# Patient Record
Sex: Female | Born: 1940 | Race: Black or African American | Hispanic: No | Smoking: Never smoker
Health system: Southern US, Community
[De-identification: ages and names within clinical notes are randomized; demographics above are authoritative.]

## PROBLEM LIST (undated history)

## (undated) DIAGNOSIS — D573 Sickle-cell trait: Secondary | ICD-10-CM

## (undated) DIAGNOSIS — C50919 Malignant neoplasm of unspecified site of unspecified female breast: Secondary | ICD-10-CM

## (undated) DIAGNOSIS — Z9889 Other specified postprocedural states: Secondary | ICD-10-CM

## (undated) DIAGNOSIS — I10 Essential (primary) hypertension: Secondary | ICD-10-CM

## (undated) HISTORY — PX: ABDOMINAL HYSTERECTOMY: SHX81

## (undated) HISTORY — DX: Other specified postprocedural states: Z98.890

## (undated) HISTORY — PX: OTHER SURGICAL HISTORY: SHX169

## (undated) HISTORY — DX: Sickle-cell trait: D57.3

## (undated) HISTORY — DX: Malignant neoplasm of unspecified site of unspecified female breast: C50.919

## (undated) HISTORY — PX: APPENDECTOMY: SHX54

## (undated) HISTORY — DX: Essential (primary) hypertension: I10

## (undated) HISTORY — PX: BACK SURGERY: SHX140

## (undated) HISTORY — PX: KNEE ARTHROSCOPY: SHX127

## (undated) HISTORY — PX: CHOLECYSTECTOMY: SHX55

---

## 1969-09-20 HISTORY — PX: TUBAL LIGATION: SHX77

## 1984-09-20 HISTORY — PX: MASTECTOMY PARTIAL / LUMPECTOMY: SUR851

## 1997-09-20 HISTORY — PX: OTHER SURGICAL HISTORY: SHX169

## 2001-01-02 ENCOUNTER — Encounter: Admission: RE | Admit: 2001-01-02 | Discharge: 2001-01-02 | Payer: Self-pay | Admitting: Oncology

## 2001-01-02 ENCOUNTER — Encounter (HOSPITAL_COMMUNITY): Admission: RE | Admit: 2001-01-02 | Discharge: 2001-02-01 | Payer: Self-pay | Admitting: Oncology

## 2001-05-10 ENCOUNTER — Emergency Department (HOSPITAL_COMMUNITY): Admission: EM | Admit: 2001-05-10 | Discharge: 2001-05-10 | Payer: Self-pay | Admitting: Internal Medicine

## 2001-05-10 ENCOUNTER — Encounter: Payer: Self-pay | Admitting: Internal Medicine

## 2001-06-12 ENCOUNTER — Encounter (HOSPITAL_COMMUNITY): Admission: RE | Admit: 2001-06-12 | Discharge: 2001-07-12 | Payer: Self-pay | Admitting: Oncology

## 2001-06-12 ENCOUNTER — Encounter: Admission: RE | Admit: 2001-06-12 | Discharge: 2001-06-12 | Payer: Self-pay | Admitting: Oncology

## 2001-12-11 ENCOUNTER — Encounter: Admission: RE | Admit: 2001-12-11 | Discharge: 2001-12-11 | Payer: Self-pay | Admitting: Oncology

## 2001-12-11 ENCOUNTER — Encounter (HOSPITAL_COMMUNITY): Admission: RE | Admit: 2001-12-11 | Discharge: 2002-01-10 | Payer: Self-pay | Admitting: Oncology

## 2001-12-12 ENCOUNTER — Encounter (HOSPITAL_COMMUNITY): Payer: Self-pay | Admitting: Oncology

## 2002-05-24 ENCOUNTER — Encounter: Payer: Self-pay | Admitting: Emergency Medicine

## 2002-05-24 ENCOUNTER — Inpatient Hospital Stay (HOSPITAL_COMMUNITY): Admission: EM | Admit: 2002-05-24 | Discharge: 2002-05-26 | Payer: Self-pay | Admitting: *Deleted

## 2002-06-18 ENCOUNTER — Encounter (HOSPITAL_COMMUNITY): Admission: RE | Admit: 2002-06-18 | Discharge: 2002-07-18 | Payer: Self-pay | Admitting: Oncology

## 2002-06-18 ENCOUNTER — Encounter: Admission: RE | Admit: 2002-06-18 | Discharge: 2002-06-18 | Payer: Self-pay | Admitting: Oncology

## 2002-12-25 ENCOUNTER — Encounter (HOSPITAL_COMMUNITY): Admission: RE | Admit: 2002-12-25 | Discharge: 2003-01-24 | Payer: Self-pay | Admitting: Oncology

## 2002-12-25 ENCOUNTER — Encounter: Admission: RE | Admit: 2002-12-25 | Discharge: 2002-12-25 | Payer: Self-pay | Admitting: Oncology

## 2003-06-26 ENCOUNTER — Encounter (HOSPITAL_COMMUNITY): Admission: RE | Admit: 2003-06-26 | Discharge: 2003-07-26 | Payer: Self-pay | Admitting: Oncology

## 2003-06-26 ENCOUNTER — Encounter: Admission: RE | Admit: 2003-06-26 | Discharge: 2003-06-26 | Payer: Self-pay | Admitting: Oncology

## 2003-06-29 ENCOUNTER — Emergency Department (HOSPITAL_COMMUNITY): Admission: EM | Admit: 2003-06-29 | Discharge: 2003-06-29 | Payer: Self-pay | Admitting: Emergency Medicine

## 2003-06-29 ENCOUNTER — Encounter: Payer: Self-pay | Admitting: Emergency Medicine

## 2003-07-02 ENCOUNTER — Encounter: Payer: Self-pay | Admitting: Orthopaedic Surgery

## 2003-07-02 ENCOUNTER — Ambulatory Visit (HOSPITAL_COMMUNITY): Admission: RE | Admit: 2003-07-02 | Discharge: 2003-07-02 | Payer: Self-pay | Admitting: Orthopaedic Surgery

## 2003-07-04 ENCOUNTER — Ambulatory Visit (HOSPITAL_COMMUNITY): Admission: RE | Admit: 2003-07-04 | Discharge: 2003-07-04 | Payer: Self-pay | Admitting: Orthopaedic Surgery

## 2003-12-19 ENCOUNTER — Encounter (HOSPITAL_COMMUNITY): Admission: RE | Admit: 2003-12-19 | Discharge: 2004-01-18 | Payer: Self-pay | Admitting: Oncology

## 2004-04-28 ENCOUNTER — Emergency Department (HOSPITAL_COMMUNITY): Admission: EM | Admit: 2004-04-28 | Discharge: 2004-04-28 | Payer: Self-pay | Admitting: Emergency Medicine

## 2004-05-27 ENCOUNTER — Other Ambulatory Visit: Admission: RE | Admit: 2004-05-27 | Discharge: 2004-05-27 | Payer: Self-pay | Admitting: Otolaryngology

## 2004-06-02 ENCOUNTER — Encounter (HOSPITAL_COMMUNITY): Admission: RE | Admit: 2004-06-02 | Discharge: 2004-06-19 | Payer: Self-pay | Admitting: Pulmonary Disease

## 2004-06-16 ENCOUNTER — Ambulatory Visit (HOSPITAL_COMMUNITY): Admission: RE | Admit: 2004-06-16 | Discharge: 2004-06-16 | Payer: Self-pay | Admitting: Otolaryngology

## 2004-07-06 ENCOUNTER — Encounter: Admission: RE | Admit: 2004-07-06 | Discharge: 2004-07-06 | Payer: Self-pay | Admitting: Oncology

## 2004-07-06 ENCOUNTER — Encounter (HOSPITAL_COMMUNITY): Admission: RE | Admit: 2004-07-06 | Discharge: 2004-08-05 | Payer: Self-pay | Admitting: Oncology

## 2004-09-30 ENCOUNTER — Ambulatory Visit (HOSPITAL_COMMUNITY): Admission: RE | Admit: 2004-09-30 | Discharge: 2004-09-30 | Payer: Self-pay | Admitting: Otolaryngology

## 2004-10-07 ENCOUNTER — Ambulatory Visit (HOSPITAL_COMMUNITY): Admission: RE | Admit: 2004-10-07 | Discharge: 2004-10-07 | Payer: Self-pay | Admitting: Otolaryngology

## 2005-03-03 ENCOUNTER — Ambulatory Visit (HOSPITAL_COMMUNITY): Admission: RE | Admit: 2005-03-03 | Discharge: 2005-03-03 | Payer: Self-pay | Admitting: Pulmonary Disease

## 2005-04-15 ENCOUNTER — Encounter (HOSPITAL_COMMUNITY): Admission: RE | Admit: 2005-04-15 | Discharge: 2005-05-15 | Payer: Self-pay | Admitting: Orthopaedic Surgery

## 2005-09-20 HISTORY — PX: NECK SURGERY: SHX720

## 2006-06-06 ENCOUNTER — Encounter: Admission: RE | Admit: 2006-06-06 | Discharge: 2006-06-17 | Payer: Self-pay | Admitting: Oncology

## 2006-06-06 ENCOUNTER — Ambulatory Visit (HOSPITAL_COMMUNITY): Payer: Self-pay | Admitting: Oncology

## 2006-06-09 ENCOUNTER — Ambulatory Visit: Payer: Self-pay | Admitting: Internal Medicine

## 2006-08-18 ENCOUNTER — Encounter (HOSPITAL_COMMUNITY): Admission: RE | Admit: 2006-08-18 | Discharge: 2006-09-17 | Payer: Self-pay | Admitting: Oncology

## 2006-08-18 ENCOUNTER — Encounter: Admission: RE | Admit: 2006-08-18 | Discharge: 2006-08-18 | Payer: Self-pay | Admitting: Oncology

## 2006-09-05 ENCOUNTER — Ambulatory Visit (HOSPITAL_COMMUNITY): Payer: Self-pay | Admitting: Oncology

## 2007-04-11 ENCOUNTER — Ambulatory Visit (HOSPITAL_COMMUNITY): Payer: Self-pay | Admitting: Oncology

## 2007-04-11 ENCOUNTER — Encounter (HOSPITAL_COMMUNITY): Admission: RE | Admit: 2007-04-11 | Discharge: 2007-05-11 | Payer: Self-pay | Admitting: Oncology

## 2007-04-24 ENCOUNTER — Ambulatory Visit (HOSPITAL_COMMUNITY): Admission: RE | Admit: 2007-04-24 | Discharge: 2007-04-24 | Payer: Self-pay | Admitting: Pulmonary Disease

## 2007-10-17 ENCOUNTER — Encounter (HOSPITAL_COMMUNITY): Admission: RE | Admit: 2007-10-17 | Discharge: 2007-11-16 | Payer: Self-pay | Admitting: Oncology

## 2007-10-17 ENCOUNTER — Ambulatory Visit (HOSPITAL_COMMUNITY): Payer: Self-pay | Admitting: Oncology

## 2008-11-12 ENCOUNTER — Encounter (HOSPITAL_COMMUNITY): Admission: RE | Admit: 2008-11-12 | Discharge: 2008-12-12 | Payer: Self-pay | Admitting: Oncology

## 2008-11-12 ENCOUNTER — Ambulatory Visit (HOSPITAL_COMMUNITY): Payer: Self-pay | Admitting: Oncology

## 2009-05-29 ENCOUNTER — Encounter (HOSPITAL_COMMUNITY): Admission: RE | Admit: 2009-05-29 | Discharge: 2009-06-18 | Payer: Self-pay | Admitting: Oncology

## 2009-05-29 ENCOUNTER — Ambulatory Visit (HOSPITAL_COMMUNITY): Payer: Self-pay | Admitting: Oncology

## 2009-12-11 ENCOUNTER — Ambulatory Visit (HOSPITAL_COMMUNITY): Payer: Self-pay | Admitting: Oncology

## 2009-12-11 ENCOUNTER — Encounter (HOSPITAL_COMMUNITY): Admission: RE | Admit: 2009-12-11 | Discharge: 2010-01-10 | Payer: Self-pay | Admitting: Oncology

## 2010-06-15 ENCOUNTER — Encounter (INDEPENDENT_AMBULATORY_CARE_PROVIDER_SITE_OTHER): Payer: Self-pay | Admitting: *Deleted

## 2010-06-17 ENCOUNTER — Ambulatory Visit (HOSPITAL_COMMUNITY): Payer: Self-pay | Admitting: Oncology

## 2010-06-17 ENCOUNTER — Encounter (HOSPITAL_COMMUNITY)
Admission: RE | Admit: 2010-06-17 | Discharge: 2010-07-17 | Payer: Self-pay | Source: Home / Self Care | Admitting: Oncology

## 2010-09-20 HISTORY — PX: CATARACT EXTRACTION: SUR2

## 2010-10-10 ENCOUNTER — Encounter (HOSPITAL_COMMUNITY): Payer: Self-pay | Admitting: Oncology

## 2010-10-11 ENCOUNTER — Encounter (HOSPITAL_COMMUNITY): Payer: Self-pay | Admitting: Oncology

## 2010-10-22 NOTE — Letter (Signed)
Summary: Recall, Screening Colonoscopy Only  [facility]  [address]   [place], [place] [address]   Phone: [phone]  Fax: [phone]    June 15, 2010  [facility] [address] [place], [place]  [address] [birth year]   Dear Ms. Intriago,   Our records indicate it is time to schedule your colonoscopy.    Please call our office at [phone] and ask for the nurse.   Thank you,    Hendricks Limes, LPN Cloria Spring, LPN  [facility] Ph: [phone]   Fax: [phone]   Appended Document: Recall, Screening Colonoscopy Only The above letter was returned. A new letter mailed today, addressed to [address]  [place], [place] [address]  (address in IDX )

## 2010-12-03 LAB — COMPREHENSIVE METABOLIC PANEL
AST: 20 U/L (ref 0–37)
Albumin: 3.6 g/dL (ref 3.5–5.2)
Alkaline Phosphatase: 72 U/L (ref 39–117)
BUN: 16 mg/dL (ref 6–23)
CO2: 28 mEq/L (ref 19–32)
Chloride: 102 mEq/L (ref 96–112)
Creatinine, Ser: 1.16 mg/dL (ref 0.4–1.2)
GFR calc Af Amer: 56 mL/min — ABNORMAL LOW (ref >60)
GFR calc non Af Amer: 46 mL/min — ABNORMAL LOW (ref >60)
Potassium: 3.7 mEq/L (ref 3.5–5.1)
Total Bilirubin: 0.6 mg/dL (ref 0.3–1.2)

## 2010-12-07 ENCOUNTER — Other Ambulatory Visit (HOSPITAL_COMMUNITY): Payer: Self-pay | Admitting: Oncology

## 2010-12-07 DIAGNOSIS — Z139 Encounter for screening, unspecified: Secondary | ICD-10-CM

## 2010-12-11 ENCOUNTER — Ambulatory Visit (HOSPITAL_COMMUNITY)
Admission: RE | Admit: 2010-12-11 | Discharge: 2010-12-11 | Disposition: A | Discharge status: Home / Self Care | Payer: Medicare Other | Source: Ambulatory Visit | Attending: Oncology | Admitting: Oncology

## 2010-12-11 DIAGNOSIS — Z139 Encounter for screening, unspecified: Secondary | ICD-10-CM

## 2010-12-11 DIAGNOSIS — Z1231 Encounter for screening mammogram for malignant neoplasm of breast: Secondary | ICD-10-CM | POA: Insufficient documentation

## 2010-12-11 LAB — COMPREHENSIVE METABOLIC PANEL
Albumin: 3.6 g/dL (ref 3.5–5.2)
Alkaline Phosphatase: 100 U/L (ref 39–117)
BUN: 4 mg/dL — ABNORMAL LOW (ref 6–23)
Calcium: 9.4 mg/dL (ref 8.4–10.5)
Glucose, Bld: 227 mg/dL — ABNORMAL HIGH (ref 70–99)
Potassium: 3.8 mEq/L (ref 3.5–5.1)
Total Protein: 6.1 g/dL (ref 6.0–8.3)

## 2010-12-15 ENCOUNTER — Other Ambulatory Visit (HOSPITAL_COMMUNITY): Payer: Self-pay | Admitting: Oncology

## 2010-12-15 DIAGNOSIS — R928 Other abnormal and inconclusive findings on diagnostic imaging of breast: Secondary | ICD-10-CM

## 2010-12-16 ENCOUNTER — Ambulatory Visit (HOSPITAL_COMMUNITY): Payer: Self-pay

## 2010-12-21 ENCOUNTER — Encounter (HOSPITAL_COMMUNITY): Payer: Medicare Other | Attending: Oncology

## 2010-12-21 ENCOUNTER — Other Ambulatory Visit (HOSPITAL_COMMUNITY): Payer: Self-pay | Admitting: Oncology

## 2010-12-21 DIAGNOSIS — M810 Age-related osteoporosis without current pathological fracture: Secondary | ICD-10-CM

## 2010-12-21 DIAGNOSIS — Z853 Personal history of malignant neoplasm of breast: Secondary | ICD-10-CM | POA: Insufficient documentation

## 2010-12-21 DIAGNOSIS — E119 Type 2 diabetes mellitus without complications: Secondary | ICD-10-CM | POA: Insufficient documentation

## 2010-12-21 DIAGNOSIS — C8589 Other specified types of non-Hodgkin lymphoma, extranodal and solid organ sites: Secondary | ICD-10-CM

## 2010-12-21 DIAGNOSIS — Z79899 Other long term (current) drug therapy: Secondary | ICD-10-CM | POA: Insufficient documentation

## 2010-12-21 DIAGNOSIS — Z9221 Personal history of antineoplastic chemotherapy: Secondary | ICD-10-CM | POA: Insufficient documentation

## 2010-12-21 LAB — COMPREHENSIVE METABOLIC PANEL
ALT: 16 U/L (ref 0–35)
AST: 16 U/L (ref 0–37)
Alkaline Phosphatase: 65 U/L (ref 39–117)
CO2: 25 mEq/L (ref 19–32)
Chloride: 107 mEq/L (ref 96–112)
GFR calc Af Amer: >60 mL/min (ref >60)
GFR calc non Af Amer: >60 mL/min (ref >60)
Glucose, Bld: 181 mg/dL — ABNORMAL HIGH (ref 70–99)
Potassium: 3.6 mEq/L (ref 3.5–5.1)
Sodium: 140 mEq/L (ref 135–145)
Total Bilirubin: 0.4 mg/dL (ref 0.3–1.2)

## 2010-12-23 ENCOUNTER — Other Ambulatory Visit (HOSPITAL_COMMUNITY): Payer: Self-pay | Admitting: Oncology

## 2010-12-23 ENCOUNTER — Ambulatory Visit (HOSPITAL_COMMUNITY)
Admission: RE | Admit: 2010-12-23 | Discharge: 2010-12-23 | Disposition: A | Discharge status: Home / Self Care | Payer: Medicare Other | Source: Ambulatory Visit | Attending: Oncology | Admitting: Oncology

## 2010-12-23 DIAGNOSIS — R921 Mammographic calcification found on diagnostic imaging of breast: Secondary | ICD-10-CM

## 2010-12-23 DIAGNOSIS — R928 Other abnormal and inconclusive findings on diagnostic imaging of breast: Secondary | ICD-10-CM

## 2010-12-25 LAB — COMPREHENSIVE METABOLIC PANEL
AST: 27 U/L (ref 0–37)
Albumin: 4 g/dL (ref 3.5–5.2)
BUN: 4 mg/dL — ABNORMAL LOW (ref 6–23)
Calcium: 9.7 mg/dL (ref 8.4–10.5)
Chloride: 105 mEq/L (ref 96–112)
Creatinine, Ser: 0.85 mg/dL (ref 0.4–1.2)
GFR calc Af Amer: >60 mL/min (ref >60)
Total Bilirubin: 0.6 mg/dL (ref 0.3–1.2)
Total Protein: 7.1 g/dL (ref 6.0–8.3)

## 2010-12-28 ENCOUNTER — Ambulatory Visit
Admission: RE | Admit: 2010-12-28 | Discharge: 2010-12-28 | Disposition: A | Discharge status: Home / Self Care | Payer: Medicare Other | Source: Ambulatory Visit | Attending: Oncology | Admitting: Oncology

## 2010-12-28 ENCOUNTER — Other Ambulatory Visit: Payer: Self-pay | Admitting: Diagnostic Radiology

## 2010-12-28 ENCOUNTER — Other Ambulatory Visit (HOSPITAL_COMMUNITY): Payer: Self-pay | Admitting: Oncology

## 2010-12-28 DIAGNOSIS — R921 Mammographic calcification found on diagnostic imaging of breast: Secondary | ICD-10-CM

## 2011-01-05 LAB — COMPREHENSIVE METABOLIC PANEL
Albumin: 3.8 g/dL (ref 3.5–5.2)
Alkaline Phosphatase: 116 U/L (ref 39–117)
BUN: 6 mg/dL (ref 6–23)
Calcium: 9.7 mg/dL (ref 8.4–10.5)
Glucose, Bld: 295 mg/dL — ABNORMAL HIGH (ref 70–99)
Potassium: 3.6 mEq/L (ref 3.5–5.1)
Total Protein: 6.7 g/dL (ref 6.0–8.3)

## 2011-01-05 LAB — DIFFERENTIAL
Lymphocytes Relative: 32 % (ref 12–46)
Lymphs Abs: 1.6 10*3/uL (ref 0.7–4.0)
Monocytes Absolute: 0.2 10*3/uL (ref 0.1–1.0)
Monocytes Relative: 4 % (ref 3–12)
Neutro Abs: 3 10*3/uL (ref 1.7–7.7)
Neutrophils Relative %: 61 % (ref 43–77)

## 2011-01-05 LAB — CBC
HCT: 38 % (ref 36.0–46.0)
Hemoglobin: 12.9 g/dL (ref 12.0–15.0)
MCHC: 34 g/dL (ref 30.0–36.0)
RBC: 4.1 MIL/uL (ref 3.87–5.11)

## 2011-02-03 ENCOUNTER — Other Ambulatory Visit (HOSPITAL_COMMUNITY): Payer: Medicare Other

## 2011-02-05 NOTE — Cardiovascular Report (Signed)
NAME:  Ashley Gonzales, Ashley Gonzales                          ACCOUNT NO.:  [id number]   MEDICAL RECORD NO.:  [id number]                   PATIENT TYPE:  INP   LOCATION:  2030                                 FACILITY:  [facility]   PHYSICIAN:  Lennette Bihari, M.D.               DATE OF BIRTH:  11-08-1940   DATE OF PROCEDURE:  DATE OF DISCHARGE:  05/26/2002                              CARDIAC CATHETERIZATION   INDICATION:  The patient is a 70 year old female who had presented to [facility] Emergency Room yesterday with chest pain that had been waxing and  waning.  The pain was somewhat improved with nitroglycerin. She ultimately  was transferred to [facility]. Cardiac enzymes were negative x2.  Risk factors also include type 2 diabetes mellitus, hypertension, and family  history of coronary disease. 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  She is referred for definitive catheterization.   DESCRIPTION OF PROCEDURE:  After premedication with Valium 5 mg p.o., the  patient was prepped and draped in the usual fashion. The right femoral  artery was punctured anteriorly, and a 6 French sheath was inserted.  Diagnostic catheterization was done with 6 French Judkins 4 left and right  coronary catheters. A pigtail catheter was used for biplane left  ventriculography as well as distal aortography. Hemostasis was obtained by  direct manual pressure.   HEMODYNAMIC DATA:  Central aortic pressure 135/69.  Left ventricular  pressure 135/16.   ANGIOGRAPHIC DATA:  Left main coronary artery:  The left main coronary  artery was angiographically normal and bifurcated in the LAD and left  circumflex system.   Left anterior descending:  The LAD had 30% smooth ostial narrowing. The LAD  gave rise to a prominent septal and mid diagonal vessel.   Circumflex artery:  The circumflex vessel gave rise to three marginal  vessels and was angiographically normal.   Right coronary artery:  The  right coronary artery ended in the PDA and small  inferior posterolateral branches. There was mild 20% proximal narrowing.   Biplane cine left ventriculography revealed normal LV function without focal  segmental wall motion abnormalities.   DISTAL AORTOGRAPHY:  Distal aortography revealed mild tortuosity of the  infrarenal aorta. There was no renal artery stenosis.  There was no  significant other aortoiliac disease.    IMPRESSION:  1. Normal left ventricular function.  2. Significant coronary obstructive disease with 30% smooth narrowing of the     ostium of the left anterior descending artery, 20% proximal right     coronary artery narrowing.                                                  Lennette Bihari, M.D.  TAK/MEDQ  D:  05/25/2002  T:  05/28/2002  Job:  [id number]   cc:   Merrilee Seashore, M.D.   Hilario Quarry, M.D.

## 2011-02-05 NOTE — Discharge Summary (Signed)
   Ashley Gonzales, Ashley Gonzales                          ACCOUNT NO.:  [id number]   MEDICAL RECORD NO.:  [id number]                   PATIENT TYPE:  INP   LOCATION:  2030                                 FACILITY:  [facility]   PHYSICIAN:  Runell Gess, M.D.             DATE OF BIRTH:  27-May-1941   DATE OF ADMISSION:  05/24/2002  DATE OF DISCHARGE:  05/26/2002                                 DISCHARGE SUMMARY   DISCHARGE DIAGNOSES:  1. Unstable angina.  2. 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  3. Non-insulin dependent diabetes mellitus.  4. Controlled hypertension.  5. Family history of coronary artery disease.  6. History of traumatic amputation left leg.   HOSPITAL COURSE:  The patient is a 70 year old female transferred from [facility] to [facility] after she presented there with  symptoms consistent with unstable angina.  Enzymes were negative.  She was  put on heparin and nitroglycerin.  She was set up for diagnostic  catheterization which was done by Nicki Guadalajara, M.D. on May 25, 2002.  This revealed 30% narrowing of the LAD, but otherwise no significant  obstructive disease.  LV function was normal.  On May 26, 2002, she was  stable and her right groin was without hematoma.  We feel she can be  discharged and will follow up with her primary care doctor.   DISCHARGE MEDICATIONS:  1. Amaryl 4 mg a day.  2. Hydrochlorothiazide 25 mg a day.  3. Potassium 20 mEq a day.  4. Prevacid 30 mg a day.   LABORATORY DATA:  EKG revealed sinus rhythm, PACs without acute changes.  TSH 1.73.  Lipid profile shows LDL 67, HDL 58, CK-MB and troponins are  negative.  White count 5.6, hemoglobin 4.2, hematocrit 35.8, platelets 235.  Sodium 135, potassium 3.7, BUN 11, creatinine 0.9.   DISPOSITION:  The patient is discharged in stable condition and will follow  up with Dr. Juanetta Gosling.     Abelino Derrick, P.A.                      Runell Gess, M.D.    Lenard Lance  D:  06/22/2002  T:  06/26/2002  Job:  [id number]   cc:   Ramon Dredge L. Juanetta Gosling, M.D.
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
## 2011-02-05 NOTE — Op Note (Signed)
NAME:  Ashley Gonzales, Ashley Gonzales                          ACCOUNT NO.:  [id number]   MEDICAL RECORD NO.:  [id number]                   PATIENT TYPE:  AMB   LOCATION:  DAY                                  FACILITY:  [facility]   PHYSICIAN:  J. Darreld Mclean, M.D.              DATE OF BIRTH:  [birth year]   DATE OF PROCEDURE:  DATE OF DISCHARGE:                                 OPERATIVE REPORT   PREOPERATIVE DIAGNOSIS:  Tear of medial and lateral meniscus of the right  knee.   POSTOPERATIVE DIAGNOSIS:  Tear of lateral meniscus, right knee, with large  synovial flap tear but no meniscal tear medially; degenerative joint  disease.   PROCEDURE:  Operative arthroscopy of the right knee with partial lateral  meniscectomy and removal of excess synovium.   ANESTHESIA:  General.   SURGEON:  J. Darreld Mclean, M.D.   ASSISTANT:  Candace Cruise, P.A.C.   TOURNIQUET TIME:  17 minutes.   DRAINS:  None.   INDICATIONS FOR PROCEDURE:  The patient fell on the job and injured her knee  on the right.  She has a BK amputation on the left.  MRI shows a tear of the  medial and lateral meniscus of the right knee.  Surgery is recommended.  She  has marked pain and tenderness and fusion.  This is a worker's compensation  case, and we have gotten permission from them for the procedure.   OPERATIVE FINDINGS:  A lot of synovium in the anterior pouch.  Medially,  there was a large synovial flap loose in the knee, and this was removed.  The meniscus itself looked good.  There was mild DJD and grade 2-3 changes  medially.  The anterior cruciate had some slight fraying but otherwise  intact laterally.  She had grade 3 arthritic changes laterally plus a tear  of the lateral meniscus posterior horn and fraying of the anterior portion.  No loose bodies.   DESCRIPTION OF PROCEDURE:  The patient was given general anesthesia while  supine on the operating room table.  A tourniquet was placed deflated on the  right upper thigh.   The patient was prepped and draped in the usual manner.  Prior to proceeding, we established that this was Ms. Reder and that we were  doing her right knee arthroscopy.  The leg was wrapped circumferentially  with an Esmarch bandage, and the tourniquet was inflated to 350 mmHg and the  Esmarch bandage removed.  An inflow cannula was inserted medially.  I could  not get a good flow, so I moved it up a little higher and got a good flow.  The arthroscope was inserted laterally and the knee symptomatically  examined.  Please see findings above.   Using a meniscal shaver and meniscal punch, good contour was obtained of the  synovial flap that was present.  We were going to remove that, but there was  no meniscal tear.  Laterally, there was a tear on the posterior horn.  Using  the meniscal shaver and meniscal punch, we got a good smooth contour.  The  knee was symptomatically reexamined, and no new pathology was found.  The  wounds were reapproximated using 3-0 nylon.  Marcaine 0.25% was instilled  into each portal.  The tourniquet was deflated after 17 minutes.  A sterile  dressing was applied.  A bulky dressing was applied.   The patient tolerated the procedure well and went to the recovery room in  good condition.  The patient was given Vicodin ES for pain.  If she has any  difficulties, she is to contact me through the office or hospital beeper  system.  Physical therapy has been arranged for next week.      ___________________________________________                                            Teola Bradley, M.D.   JWK/MEDQ  D:  07/04/2003  T:  07/04/2003  Job:  [id number]

## 2011-02-09 ENCOUNTER — Ambulatory Visit (HOSPITAL_COMMUNITY): Admission: RE | Admit: 2011-02-09 | Payer: Medicare Other | Source: Ambulatory Visit | Admitting: Ophthalmology

## 2011-06-10 LAB — CBC
HCT: 37.2
Hemoglobin: 12.9
MCHC: 34.7
MCV: 89.7
RBC: 4.15
RDW: 13.7

## 2011-06-10 LAB — COMPREHENSIVE METABOLIC PANEL
ALT: 25
BUN: 8
CO2: 29
Calcium: 9.7
GFR calc non Af Amer: >60
Glucose, Bld: 182 — ABNORMAL HIGH
Sodium: 135
Total Protein: 6.4

## 2011-06-10 LAB — DIFFERENTIAL
Basophils Relative: 1
Eosinophils Absolute: 0.1
Lymphs Abs: 1.3
Neutro Abs: 3.3
Neutrophils Relative %: 64

## 2011-06-22 ENCOUNTER — Encounter (HOSPITAL_COMMUNITY): Payer: Medicare Other | Attending: Oncology

## 2011-06-22 DIAGNOSIS — M810 Age-related osteoporosis without current pathological fracture: Secondary | ICD-10-CM

## 2011-06-22 DIAGNOSIS — C50919 Malignant neoplasm of unspecified site of unspecified female breast: Secondary | ICD-10-CM | POA: Insufficient documentation

## 2011-06-22 DIAGNOSIS — E119 Type 2 diabetes mellitus without complications: Secondary | ICD-10-CM | POA: Insufficient documentation

## 2011-06-22 LAB — COMPREHENSIVE METABOLIC PANEL
ALT: 19 U/L (ref 0–35)
Alkaline Phosphatase: 161 U/L — ABNORMAL HIGH (ref 39–117)
BUN: 20 mg/dL (ref 6–23)
CO2: 27 mEq/L (ref 19–32)
GFR calc Af Amer: 71 mL/min — ABNORMAL LOW (ref >90)
GFR calc non Af Amer: 61 mL/min — ABNORMAL LOW (ref >90)
Glucose, Bld: 322 mg/dL — ABNORMAL HIGH (ref 70–99)
Potassium: 3.8 mEq/L (ref 3.5–5.1)
Sodium: 134 mEq/L — ABNORMAL LOW (ref 135–145)
Total Bilirubin: 0.2 mg/dL — ABNORMAL LOW (ref 0.3–1.2)

## 2011-06-22 LAB — CBC
Hemoglobin: 12.5 g/dL (ref 12.0–15.0)
MCV: 88.4 fL (ref 78.0–100.0)
Platelets: 227 10*3/uL (ref 150–400)
RBC: 4.13 MIL/uL (ref 3.87–5.11)
WBC: 5.7 10*3/uL (ref 4.0–10.5)

## 2011-06-22 LAB — DIFFERENTIAL
Lymphocytes Relative: 27 % (ref 12–46)
Lymphs Abs: 1.6 10*3/uL (ref 0.7–4.0)
Monocytes Relative: 7 % (ref 3–12)
Neutrophils Relative %: 63 % (ref 43–77)

## 2011-06-22 MED ORDER — SODIUM CHLORIDE 0.9 % IJ SOLN
INTRAMUSCULAR | Status: AC
  Filled 2011-06-22: qty 10

## 2011-06-22 MED ORDER — SODIUM CHLORIDE 0.9 % IJ SOLN
10.0000 mL | INTRAMUSCULAR | Status: DC | PRN
  Administered 2011-06-22: 10 mL via INTRAVENOUS
  Filled 2011-06-22: qty 10

## 2011-06-22 MED ORDER — SODIUM CHLORIDE 0.9 % IV SOLN
INTRAVENOUS | Status: DC
  Administered 2011-06-22: 12:00:00 via INTRAVENOUS

## 2011-06-22 MED ORDER — SODIUM CHLORIDE 0.9 % IV SOLN
30.0000 mg | Freq: Once | INTRAVENOUS | Status: AC
  Administered 2011-06-22: 30 mg via INTRAVENOUS
  Filled 2011-06-22: qty 500

## 2011-06-25 ENCOUNTER — Encounter (HOSPITAL_COMMUNITY): Payer: Self-pay | Admitting: Oncology

## 2011-06-25 ENCOUNTER — Encounter (HOSPITAL_BASED_OUTPATIENT_CLINIC_OR_DEPARTMENT_OTHER): Payer: Medicare Other | Admitting: Oncology

## 2011-06-25 DIAGNOSIS — C50919 Malignant neoplasm of unspecified site of unspecified female breast: Secondary | ICD-10-CM

## 2011-06-25 DIAGNOSIS — E119 Type 2 diabetes mellitus without complications: Secondary | ICD-10-CM

## 2011-06-25 DIAGNOSIS — I10 Essential (primary) hypertension: Secondary | ICD-10-CM

## 2011-06-25 DIAGNOSIS — Z170 Estrogen receptor positive status [ER+]: Secondary | ICD-10-CM

## 2011-06-25 MED ORDER — GLIPIZIDE 5 MG PO TABS: 5.0000 mg | ORAL_TABLET | Freq: Two times a day (BID) | ORAL | Status: DC

## 2011-06-25 NOTE — Patient Instructions (Signed)
[facility]  Discharge Instructions  RECOMMENDATIONS MADE BY THE CONSULTANT AND ANY TEST RESULTS WILL BE SENT TO YOUR REFERRING DOCTOR.   EXAM FINDINGS BY MD TODAY AND SIGNS AND SYMPTOMS TO REPORT TO CLINIC OR PRIMARY MD: doing well as far as your breast cancer.  Your diabetes needs to be under better control.  We will try to get you an appointment to see Dr. Fransico Him  MEDICATIONS PRESCRIBED: Continue metformin 2 times daily & take glipizide (glucotrol) twice daily. Follow label directions  INSTRUCTIONS GIVEN AND DISCUSSED: Report any lumps, bone pain or shortness of breath.  SPECIAL INSTRUCTIONS/FOLLOW-UP: Return to Clinic in 1 year  and Other (Referral/Appointments) will try to get you in to see Dr. Fransico Him for your diabetes management.   I acknowledge that I have been informed and understand all the instructions given to me and received a copy. I do not have any more questions at this time, but understand that I may call the [facility]ty [facility] at [facility] at [phone] during business hours should I have any further questions or need assistance in obtaining follow-up care.    __________________________________________  _____________  __________ Signature of Patient or Authorized Representative            Date                   Time    __________________________________________ Nurse's Signature

## 2011-06-25 NOTE — Progress Notes (Signed)
This office note has been dictated.
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
## 2011-07-05 LAB — COMPREHENSIVE METABOLIC PANEL
Albumin: 3.6
BUN: 5 — ABNORMAL LOW
Calcium: 9.4
Creatinine, Ser: 0.86
Total Bilirubin: 0.8
Total Protein: 6.3

## 2011-07-26 ENCOUNTER — Emergency Department (HOSPITAL_COMMUNITY)
Admission: EM | Admit: 2011-07-26 | Discharge: 2011-07-26 | Disposition: A | Discharge status: Home / Self Care | Payer: Medicare Other | Attending: Emergency Medicine | Admitting: Emergency Medicine

## 2011-07-26 ENCOUNTER — Encounter (HOSPITAL_COMMUNITY): Payer: Self-pay | Admitting: *Deleted

## 2011-07-26 ENCOUNTER — Emergency Department (HOSPITAL_COMMUNITY): Payer: Medicare Other

## 2011-07-26 DIAGNOSIS — I10 Essential (primary) hypertension: Secondary | ICD-10-CM | POA: Insufficient documentation

## 2011-07-26 DIAGNOSIS — E119 Type 2 diabetes mellitus without complications: Secondary | ICD-10-CM | POA: Insufficient documentation

## 2011-07-26 DIAGNOSIS — M25569 Pain in unspecified knee: Secondary | ICD-10-CM | POA: Insufficient documentation

## 2011-07-26 DIAGNOSIS — IMO0002 Reserved for concepts with insufficient information to code with codable children: Secondary | ICD-10-CM

## 2011-07-26 DIAGNOSIS — S88119A Complete traumatic amputation at level between knee and ankle, unspecified lower leg, initial encounter: Secondary | ICD-10-CM | POA: Insufficient documentation

## 2011-07-26 DIAGNOSIS — R296 Repeated falls: Secondary | ICD-10-CM | POA: Insufficient documentation

## 2011-07-26 DIAGNOSIS — D573 Sickle-cell trait: Secondary | ICD-10-CM | POA: Insufficient documentation

## 2011-07-26 DIAGNOSIS — W19XXXA Unspecified fall, initial encounter: Secondary | ICD-10-CM

## 2011-07-26 DIAGNOSIS — Z853 Personal history of malignant neoplasm of breast: Secondary | ICD-10-CM | POA: Insufficient documentation

## 2011-07-26 DIAGNOSIS — M25579 Pain in unspecified ankle and joints of unspecified foot: Secondary | ICD-10-CM | POA: Insufficient documentation

## 2011-07-26 MED ORDER — HYDROCODONE-ACETAMINOPHEN 5-325 MG PO TABS: 1.0000 | ORAL_TABLET | Freq: Four times a day (QID) | ORAL | Status: AC | PRN
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
## 2011-07-26 NOTE — ED Notes (Signed)
Pt c/o pain to right leg after fall; pt c/o pain to just below knee and swelling to right foot

## 2011-09-21 DIAGNOSIS — Z9889 Other specified postprocedural states: Secondary | ICD-10-CM

## 2011-09-21 HISTORY — DX: Other specified postprocedural states: Z98.890

## 2011-12-21 ENCOUNTER — Ambulatory Visit (HOSPITAL_COMMUNITY): Payer: Medicare Other

## 2012-06-23 ENCOUNTER — Encounter (HOSPITAL_COMMUNITY): Payer: Medicare Other | Attending: Oncology | Admitting: Oncology

## 2012-06-23 ENCOUNTER — Encounter (HOSPITAL_COMMUNITY): Payer: Self-pay | Admitting: Oncology

## 2012-06-23 VITALS — BP 135/74 | HR 69 | Temp 97.8°F | Resp 18 | Wt 191.6 lb

## 2012-06-23 DIAGNOSIS — I10 Essential (primary) hypertension: Secondary | ICD-10-CM

## 2012-06-23 DIAGNOSIS — C50919 Malignant neoplasm of unspecified site of unspecified female breast: Secondary | ICD-10-CM | POA: Insufficient documentation

## 2012-06-23 DIAGNOSIS — M810 Age-related osteoporosis without current pathological fracture: Secondary | ICD-10-CM

## 2012-06-23 DIAGNOSIS — E119 Type 2 diabetes mellitus without complications: Secondary | ICD-10-CM

## 2012-06-23 HISTORY — DX: Malignant neoplasm of unspecified site of unspecified female breast: C50.919

## 2012-06-23 NOTE — Patient Instructions (Addendum)
[facility]  Discharge Instructions  RECOMMENDATIONS MADE BY THE CONSULTANT AND ANY TEST RESULTS WILL BE SENT TO YOUR REFERRING DOCTOR.   EXAM FINDINGS BY MD TODAY AND SIGNS AND SYMPTOMS TO REPORT TO CLINIC OR PRIMARY MD: Exam good  Mammogram should be done yearly.   SPECIAL INSTRUCTIONS/FOLLOW-UP: Return to see [place] in one year   I acknowledge that I have been informed and understand all the instructions given to me and received a copy. I do not have any more questions at this time, but understand that I may call the [facility] at [facility] at [phone] during business hours should I have any further questions or need assistance in obtaining follow-up care.    __________________________________________  _____________  __________ Signature of Patient or Authorized Representative            Date                   Time    __________________________________________ Nurse's Signature
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
## 2012-07-17 ENCOUNTER — Ambulatory Visit (HOSPITAL_COMMUNITY)
Admission: RE | Admit: 2012-07-17 | Discharge: 2012-07-17 | Disposition: A | Discharge status: Home / Self Care | Payer: Medicare Other | Source: Ambulatory Visit | Attending: Oncology | Admitting: Oncology

## 2012-07-17 DIAGNOSIS — C50919 Malignant neoplasm of unspecified site of unspecified female breast: Secondary | ICD-10-CM

## 2012-07-17 DIAGNOSIS — Z1231 Encounter for screening mammogram for malignant neoplasm of breast: Secondary | ICD-10-CM | POA: Insufficient documentation

## 2013-01-05 ENCOUNTER — Emergency Department (HOSPITAL_COMMUNITY)
Admission: EM | Admit: 2013-01-05 | Discharge: 2013-01-05 | Disposition: A | Discharge status: Home / Self Care | Payer: Medicare Other | Attending: Emergency Medicine | Admitting: Emergency Medicine

## 2013-01-05 ENCOUNTER — Encounter (HOSPITAL_COMMUNITY): Payer: Self-pay | Admitting: *Deleted

## 2013-01-05 ENCOUNTER — Emergency Department (HOSPITAL_COMMUNITY): Payer: Medicare Other

## 2013-01-05 DIAGNOSIS — E119 Type 2 diabetes mellitus without complications: Secondary | ICD-10-CM | POA: Insufficient documentation

## 2013-01-05 DIAGNOSIS — Z853 Personal history of malignant neoplasm of breast: Secondary | ICD-10-CM | POA: Insufficient documentation

## 2013-01-05 DIAGNOSIS — Z79899 Other long term (current) drug therapy: Secondary | ICD-10-CM | POA: Insufficient documentation

## 2013-01-05 DIAGNOSIS — I10 Essential (primary) hypertension: Secondary | ICD-10-CM | POA: Insufficient documentation

## 2013-01-05 DIAGNOSIS — Z9889 Other specified postprocedural states: Secondary | ICD-10-CM | POA: Insufficient documentation

## 2013-01-05 DIAGNOSIS — Z862 Personal history of diseases of the blood and blood-forming organs and certain disorders involving the immune mechanism: Secondary | ICD-10-CM | POA: Insufficient documentation

## 2013-01-05 DIAGNOSIS — M79609 Pain in unspecified limb: Secondary | ICD-10-CM | POA: Insufficient documentation

## 2013-01-05 DIAGNOSIS — Z794 Long term (current) use of insulin: Secondary | ICD-10-CM | POA: Insufficient documentation

## 2013-01-05 DIAGNOSIS — M79674 Pain in right toe(s): Secondary | ICD-10-CM

## 2013-01-05 MED ORDER — ACETAMINOPHEN 500 MG PO TABS
1000.0000 mg | ORAL_TABLET | Freq: Once | ORAL | Status: AC
  Administered 2013-01-05: 1000 mg via ORAL
  Filled 2013-01-05: qty 2

## 2013-01-05 MED ORDER — CEPHALEXIN 500 MG PO CAPS: 500.0000 mg | ORAL_CAPSULE | Freq: Four times a day (QID) | ORAL | Status: AC

## 2013-01-05 NOTE — ED Provider Notes (Signed)
History     CSN: [id number]  Arrival date & time 01/05/13  0903   First MD Initiated Contact with Patient 01/05/13 0940      Chief Complaint  Patient presents with  . Toe Pain     HPI Pt was seen at 0950.   Per pt, c/o gradual onset and persistence of multiple intermittent episodes of right great toe "pain" for the past several months, worse over the past several days.  States her toe hurts to stand on it. Describes the pain as "aching." Denies fevers, no known injury, no open wounds, no focal motor weakness, no tingling/numbness in extremity.     Past Medical History  Diagnosis Date  . Diabetes mellitus   . Hypertension   . Breast cancer   . Sickle cell trait   . S/P colonoscopy 2013  . 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 She had a 3.2 cm primary that was ER positive, PR negative, 12 negative nodes, HER-2/neu was negative, S-phase fraction was low at 3.3%. She did take tamoxifen for a short time, but stopped it due to "side effects" and she refused further consideration of therapy.      Past Surgical History  Procedure Laterality Date  . Mastectomy partial / lumpectomy  1986  . Left mastectomy  1999  . Tubal ligation  1971  . Cholecystectomy    . Abdominal hysterectomy    . Knee arthroscopy    . Appendectomy    . Back surgery  96 and 93  . Neck surgery  2007  . Cataract extraction  2012  . Cataract surgery       History  Substance Use Topics  . Smoking status: Never Smoker   . Smokeless tobacco: Not on file  . Alcohol Use: No    OB History   Grav Para Term Preterm Abortions TAB SAB Ect Mult Living   4 4 4              Review of Systems ROS: Statement: All systems negative except as marked or noted in the HPI; Constitutional: Negative for fever and chills. ; ; Eyes: Negative for eye pain, redness and discharge. ; ; ENMT: Negative  for ear pain, hoarseness, nasal congestion, sinus pressure and sore throat. ; ; Cardiovascular: Negative for chest pain, palpitations, diaphoresis, dyspnea and peripheral edema. ; ; Respiratory: Negative for cough, wheezing and stridor. ; ; Gastrointestinal: Negative for nausea, vomiting, diarrhea, abdominal pain, blood in stool, hematemesis, jaundice and rectal bleeding. . ; ; Genitourinary: Negative for dysuria, flank pain and hematuria. ; ; Musculoskeletal: +right great toe pain. Negative for back pain and neck pain. Negative for swelling and trauma.; ; Skin: Negative for pruritus, rash, abrasions, blisters, bruising and skin lesion.; ; Neuro: Negative for headache, lightheadedness and neck stiffness. Negative for weakness, altered level of consciousness , altered mental status, extremity weakness, paresthesias, involuntary movement, seizure and syncope.       Allergies  Codeine; Aspirin; and Lidocaine  Home Medications   Current Outpatient Rx  Name  Route  Sig  Dispense  Refill  . acetaminophen (TYLENOL) 500 MG tablet   Oral   Take 500 mg by mouth as needed. For pain          . Calcium Carbonate-Vit D-Min (CALTRATE 600+D PLUS PO)   Oral   Take 2 tablets by mouth daily.           Marland Kitchen  hydrochlorothiazide (HYDRODIURIL) 25 MG tablet   Oral   Take 25 mg by mouth daily.         . insulin detemir (LEVEMIR) 100 UNIT/ML injection   Subcutaneous   Inject 40 Units into the skin at bedtime.          Marland Kitchen lisinopril (PRINIVIL,ZESTRIL) 20 MG tablet   Oral   Take 20 mg by mouth daily.         . metFORMIN (GLUCOPHAGE) 500 MG tablet   Oral   Take 1,000 mg by mouth 2 (two) times daily with a meal.          . vitamin C (ASCORBIC ACID) 500 MG tablet   Oral   Take 500 mg by mouth daily.             BP 128/52  Pulse 74  Temp(Src) 98.7 F (37.1 C) (Oral)  Resp 16  Ht 5\' 4"  (1.626 m)  Wt 193 lb (87.544 kg)  BMI 33.11 kg/m2  SpO2 100%  Physical Exam 0955: Physical  examination:  Nursing notes reviewed; Vital signs and O2 SAT reviewed;  Constitutional: Well developed, Well nourished, Well hydrated, In no acute distress; Head:  Normocephalic, atraumatic; Eyes: EOMI, PERRL, No scleral icterus; ENMT: Mouth and pharynx normal, Mucous membranes moist; Neck: Supple, Full range of motion, No lymphadenopathy; Cardiovascular: Regular rate and rhythm, No gallop; Respiratory: Breath sounds clear & equal bilaterally, No rales, rhonchi, wheezes.  Speaking full sentences with ease, Normal respiratory effort/excursion; Chest: Nontender, Movement normal; Abdomen: Soft, Nontender, Nondistended, Normal bowel sounds;; Extremities: Peripheral pulses normal, +mild erythema next to small callus at tip of right great toe with localized tenderness to palp. No ecchymosis, no deformity, no edema, no open wounds, no necrotic skin, no ulcer. Toenail missing and calloused. NT right knee/ankle/foot. No calf edema or asymmetry.; Neuro: AA&Ox3, Major CN grossly intact.  Speech clear. No gross focal motor or sensory deficits in extremities.; Skin: Color normal, Warm, Dry.    ED Course  Procedures     MDM  MDM Reviewed: previous chart, nursing note and vitals Interpretation: x-ray    Dg Toe Great Right 01/05/2013  *RADIOLOGY REPORT*  Clinical Data: Toe pain  RIGHT GREAT TOE  Comparison: None  Findings: There is no evidence of fracture or dislocation.  There is no evidence of arthropathy or other focal bone abnormality. Soft tissues are unremarkable.  IMPRESSION: No bony destruction to suggest osteomyelitis.   Original Report Authenticated By: Signa Kell, M.D.      1030:  Pt requesting post op shoe "so my regular shoe doesn't rub up against it." Distal tip of great toe has localized area of mild erythema, will tx will abx for possible early cellulitis. Does not appear to be gout; no IP or MTP joint tenderness/edema/erythema.  Dx and testing d/w pt and family.  Questions answered.  Verb  understanding, agreeable to d/c home with outpt f/u.        Laray Anger, DO 01/08/13 1307

## 2013-01-05 NOTE — ED Notes (Signed)
Discharge instructions reviewed with pt, questions answered. Pt verbalized understanding.  

## 2013-01-05 NOTE — ED Notes (Signed)
R great toe pain intermittently x 1 month or more.  Has progressively worsened until she couldn't stand on it this AM.  Nail missing from toe.  No redness noted.

## 2013-01-25 IMAGING — CR DG ANKLE COMPLETE 3+V*R*
3 series · 3 of 3 positions shown · non-contrast
Comparison: None

CLINICAL DATA: Right knee, calf, ankle and foot pain post fall

RIGHT ANKLE - COMPLETE 3+ VIEW

[view not recorded (1 of 3)]
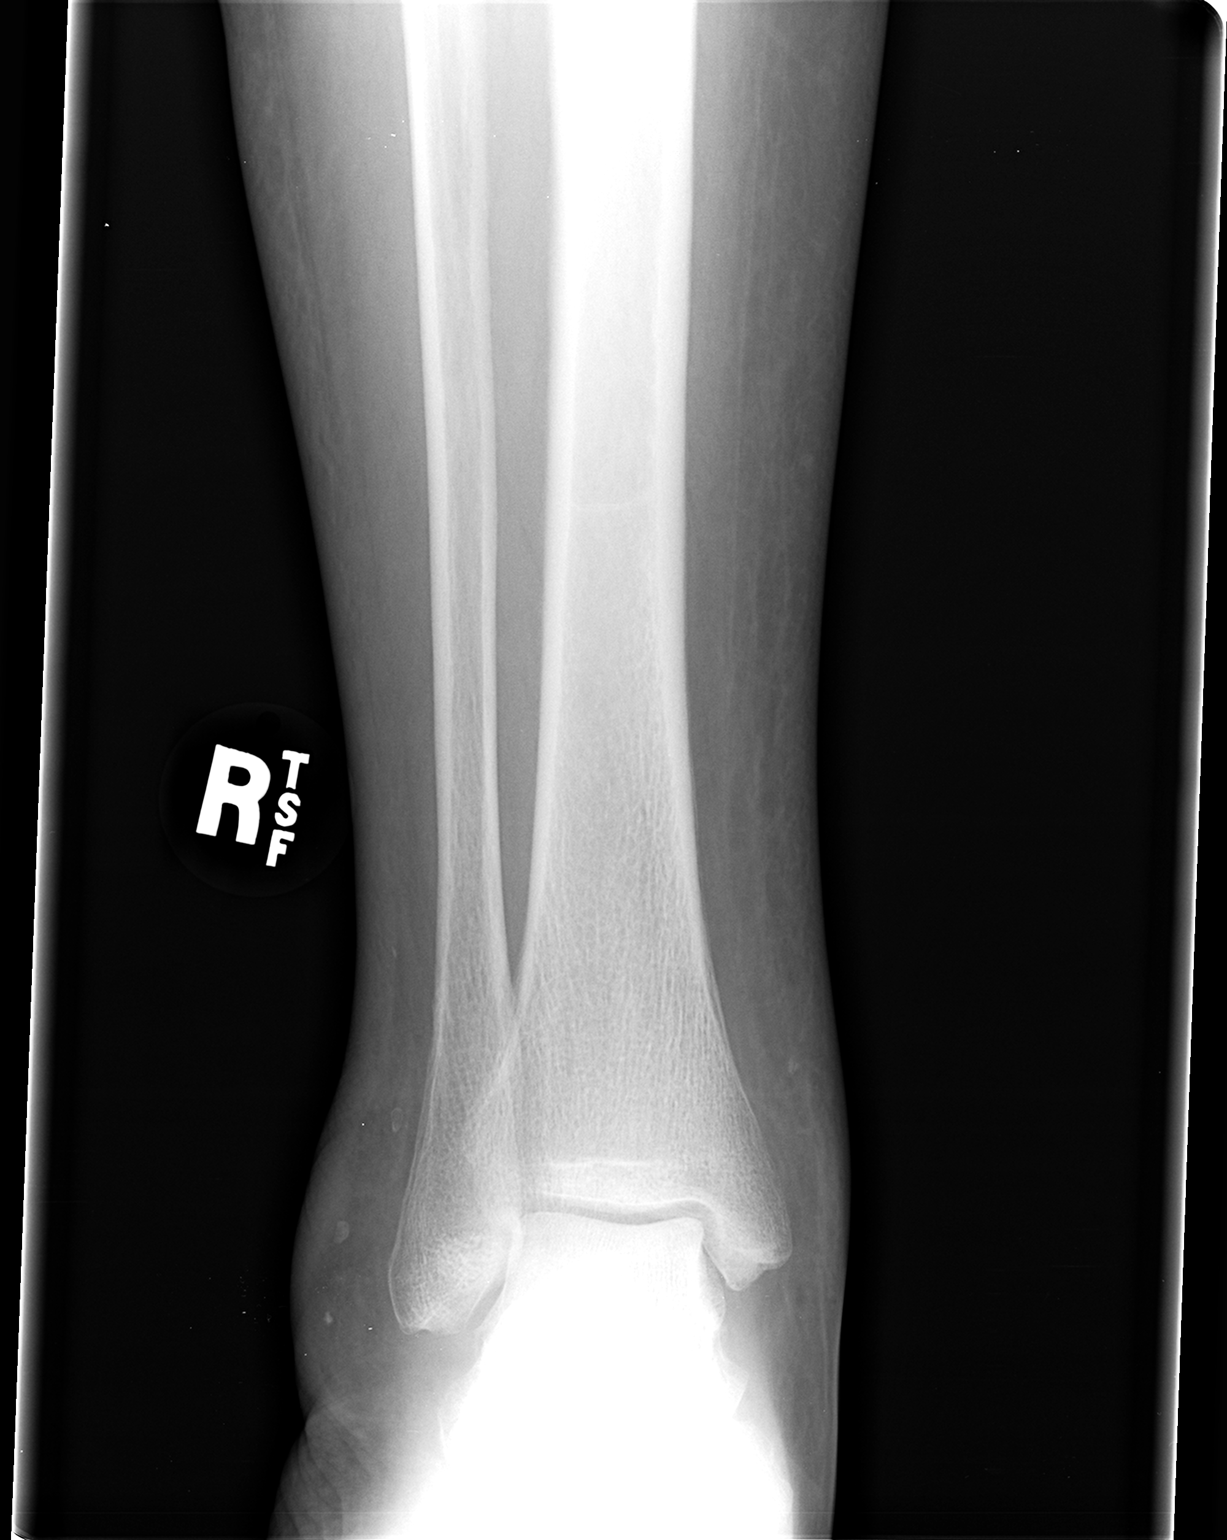

[view not recorded (2 of 3)]
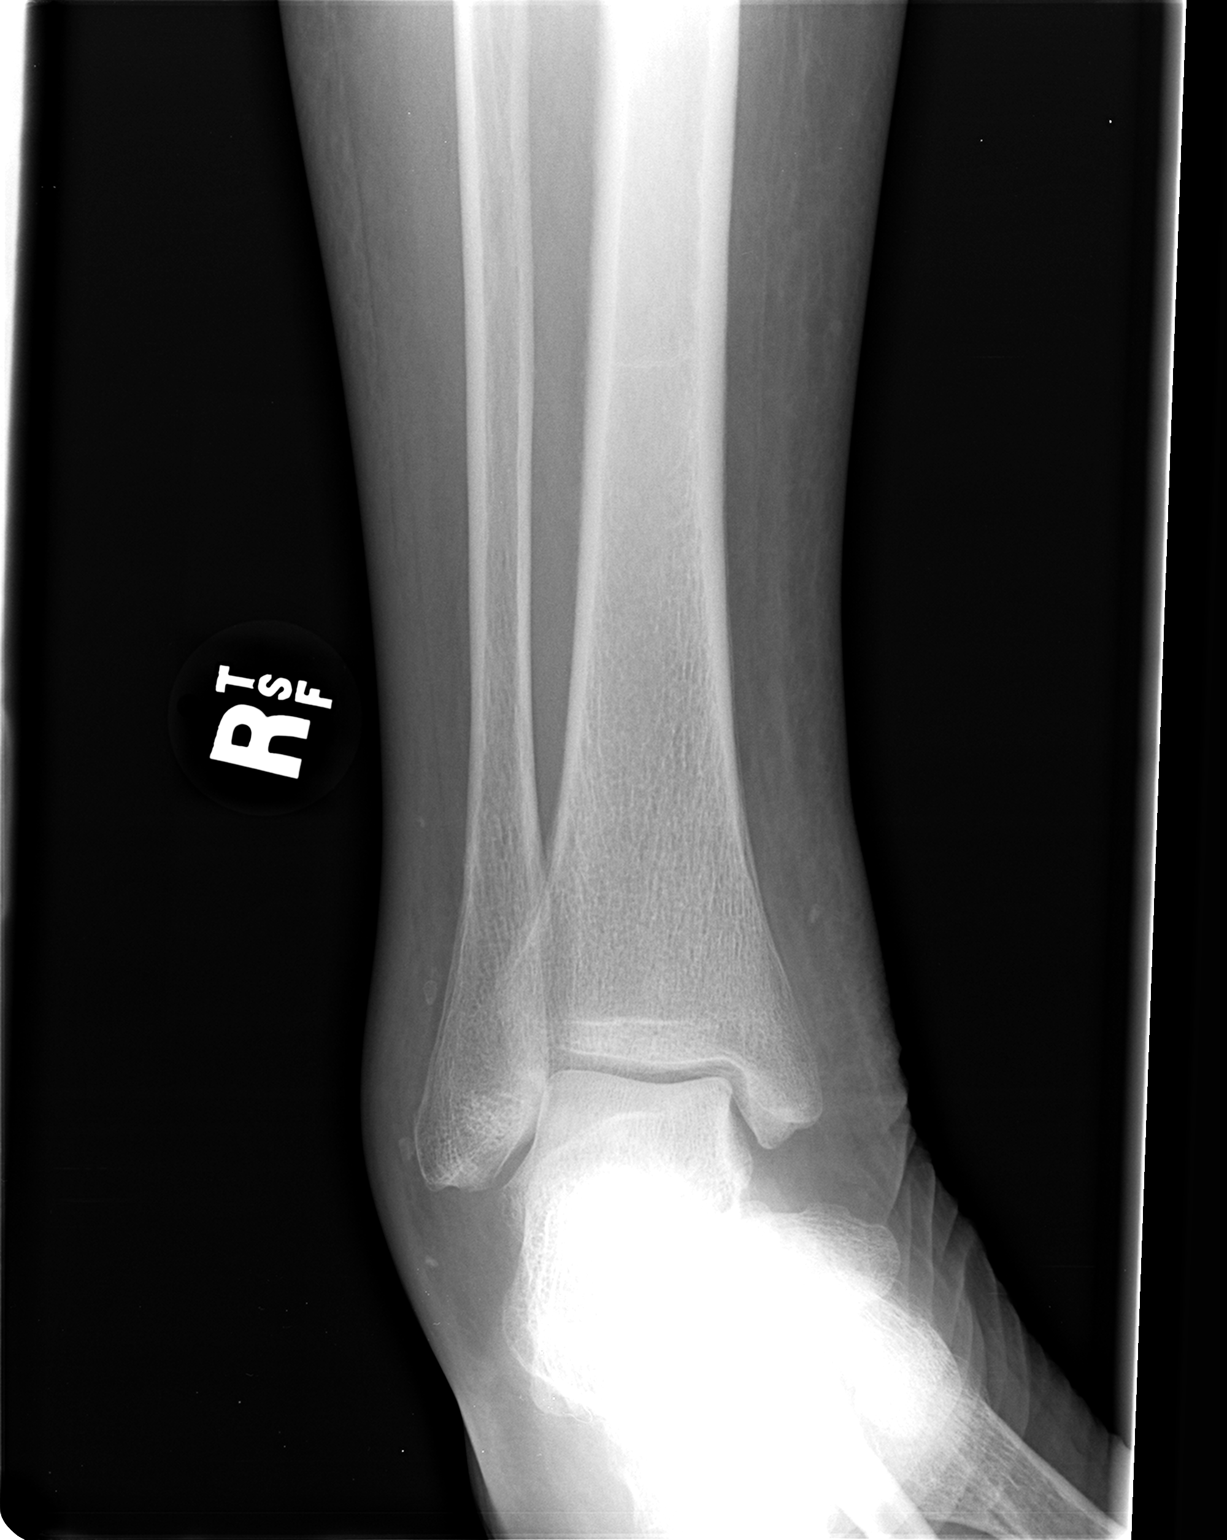

[view not recorded (3 of 3)]
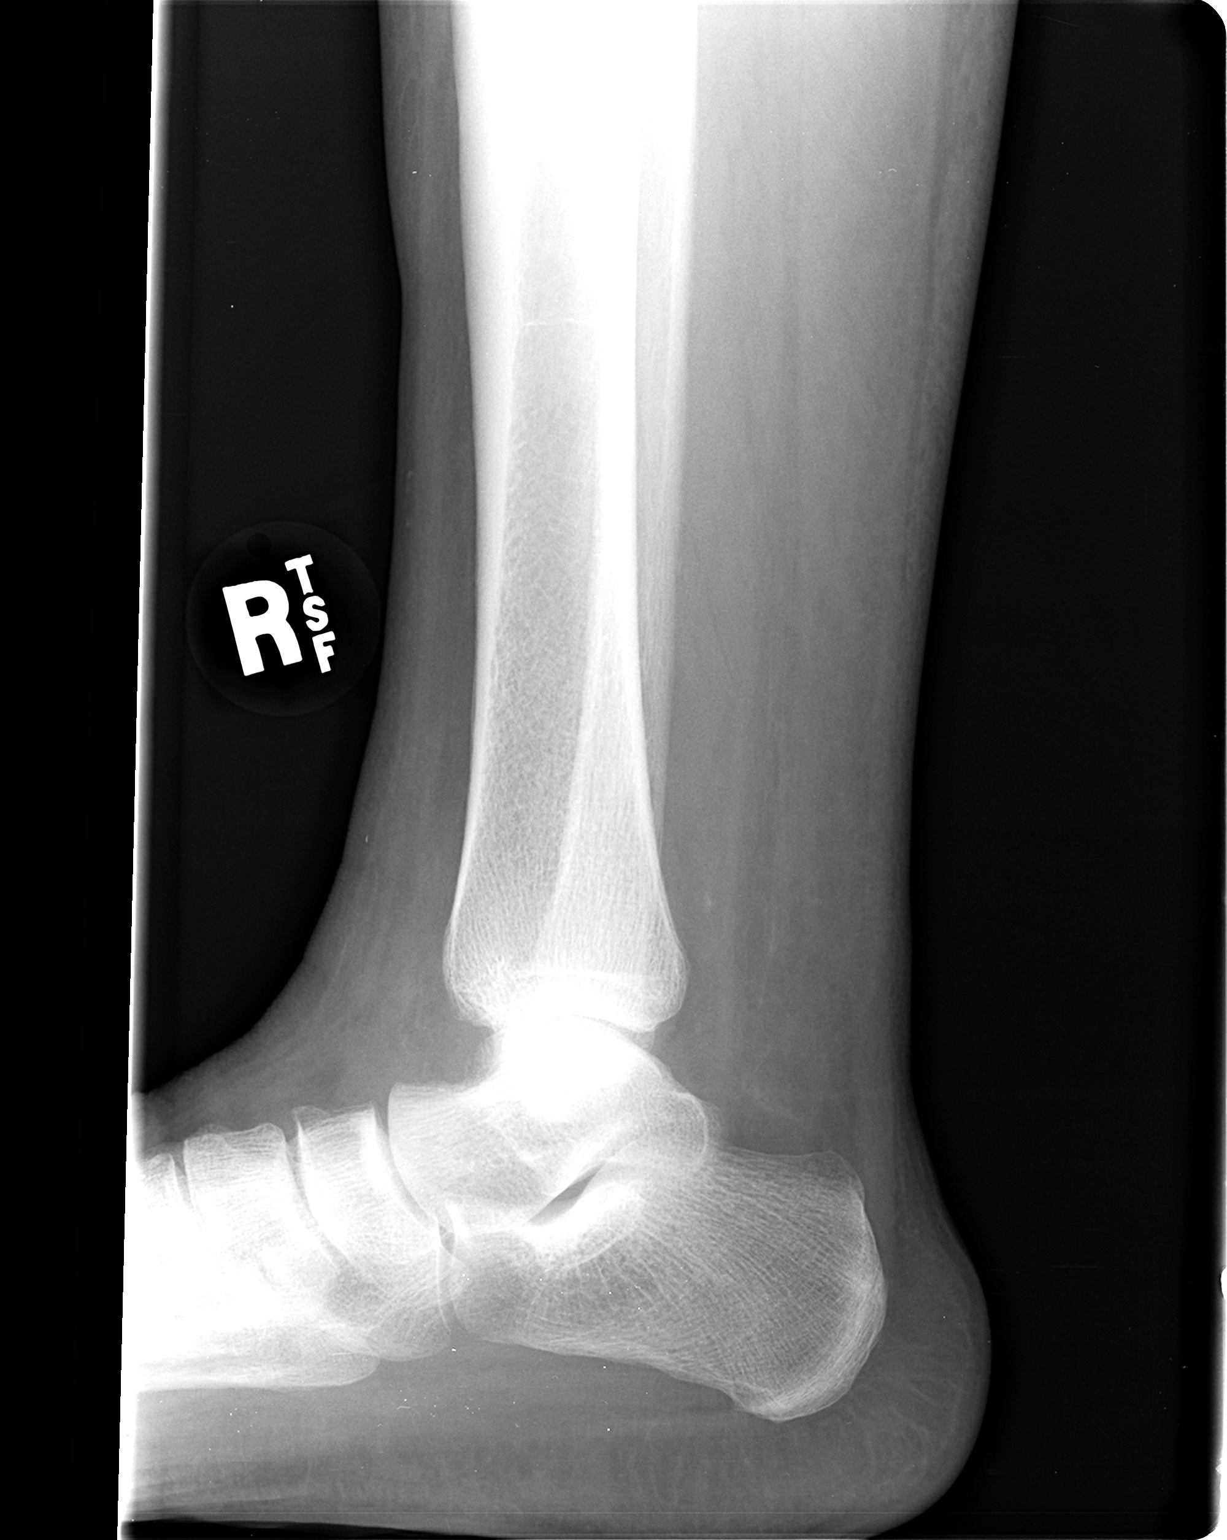

[3 of 3 positions shown; findings below may reference images not displayed]

FINDINGS: Soft tissue swelling, greatest laterally.
Ankle mortise intact.
Osseous mineralization normal.
Few benign-appearing soft tissue calcifications at ankle question
phleboliths.
No acute fracture, subluxation, or bone destruction.
Scattered cassette artifacts.
IMPRESSION: Soft tissue swelling without acute bony abnormality.

## 2013-06-22 ENCOUNTER — Encounter (HOSPITAL_COMMUNITY): Payer: Self-pay

## 2013-06-22 ENCOUNTER — Ambulatory Visit (HOSPITAL_COMMUNITY): Payer: Medicare Other

## 2013-06-25 NOTE — Progress Notes (Signed)
This encounter was created in error - please disregard.  This encounter was created in error - please disregard.

## 2014-02-06 ENCOUNTER — Emergency Department (HOSPITAL_COMMUNITY)
Admission: EM | Admit: 2014-02-06 | Discharge: 2014-02-06 | Discharge status: Against Medical Advice | Payer: Medicare HMO | Attending: Emergency Medicine | Admitting: Emergency Medicine

## 2014-02-06 ENCOUNTER — Encounter (HOSPITAL_COMMUNITY): Payer: Self-pay | Admitting: Emergency Medicine

## 2014-02-06 DIAGNOSIS — Y929 Unspecified place or not applicable: Secondary | ICD-10-CM | POA: Insufficient documentation

## 2014-02-06 DIAGNOSIS — E119 Type 2 diabetes mellitus without complications: Secondary | ICD-10-CM | POA: Insufficient documentation

## 2014-02-06 DIAGNOSIS — IMO0002 Reserved for concepts with insufficient information to code with codable children: Secondary | ICD-10-CM | POA: Insufficient documentation

## 2014-02-06 DIAGNOSIS — S4980XA Other specified injuries of shoulder and upper arm, unspecified arm, initial encounter: Secondary | ICD-10-CM | POA: Insufficient documentation

## 2014-02-06 DIAGNOSIS — Y9389 Activity, other specified: Secondary | ICD-10-CM | POA: Insufficient documentation

## 2014-02-06 DIAGNOSIS — I10 Essential (primary) hypertension: Secondary | ICD-10-CM | POA: Insufficient documentation

## 2014-02-06 DIAGNOSIS — S46909A Unspecified injury of unspecified muscle, fascia and tendon at shoulder and upper arm level, unspecified arm, initial encounter: Secondary | ICD-10-CM | POA: Insufficient documentation

## 2014-02-06 DIAGNOSIS — R296 Repeated falls: Secondary | ICD-10-CM | POA: Insufficient documentation

## 2014-02-06 NOTE — ED Notes (Signed)
Per patient turned to get something out of microwave, shoe stuck to floor causing her to fall. Per patient landed on right side causing whole right side to go "completely numb." now patient c/o pain from right shoulder to right waist. Denies hitting head or LOC.

## 2014-07-08 IMAGING — CR DG TOE GREAT 2+V*R*
3 series · 3 of 3 positions shown · non-contrast
Comparison: None

CLINICAL DATA: Toe pain

RIGHT GREAT TOE

[view not recorded (1 of 3)]
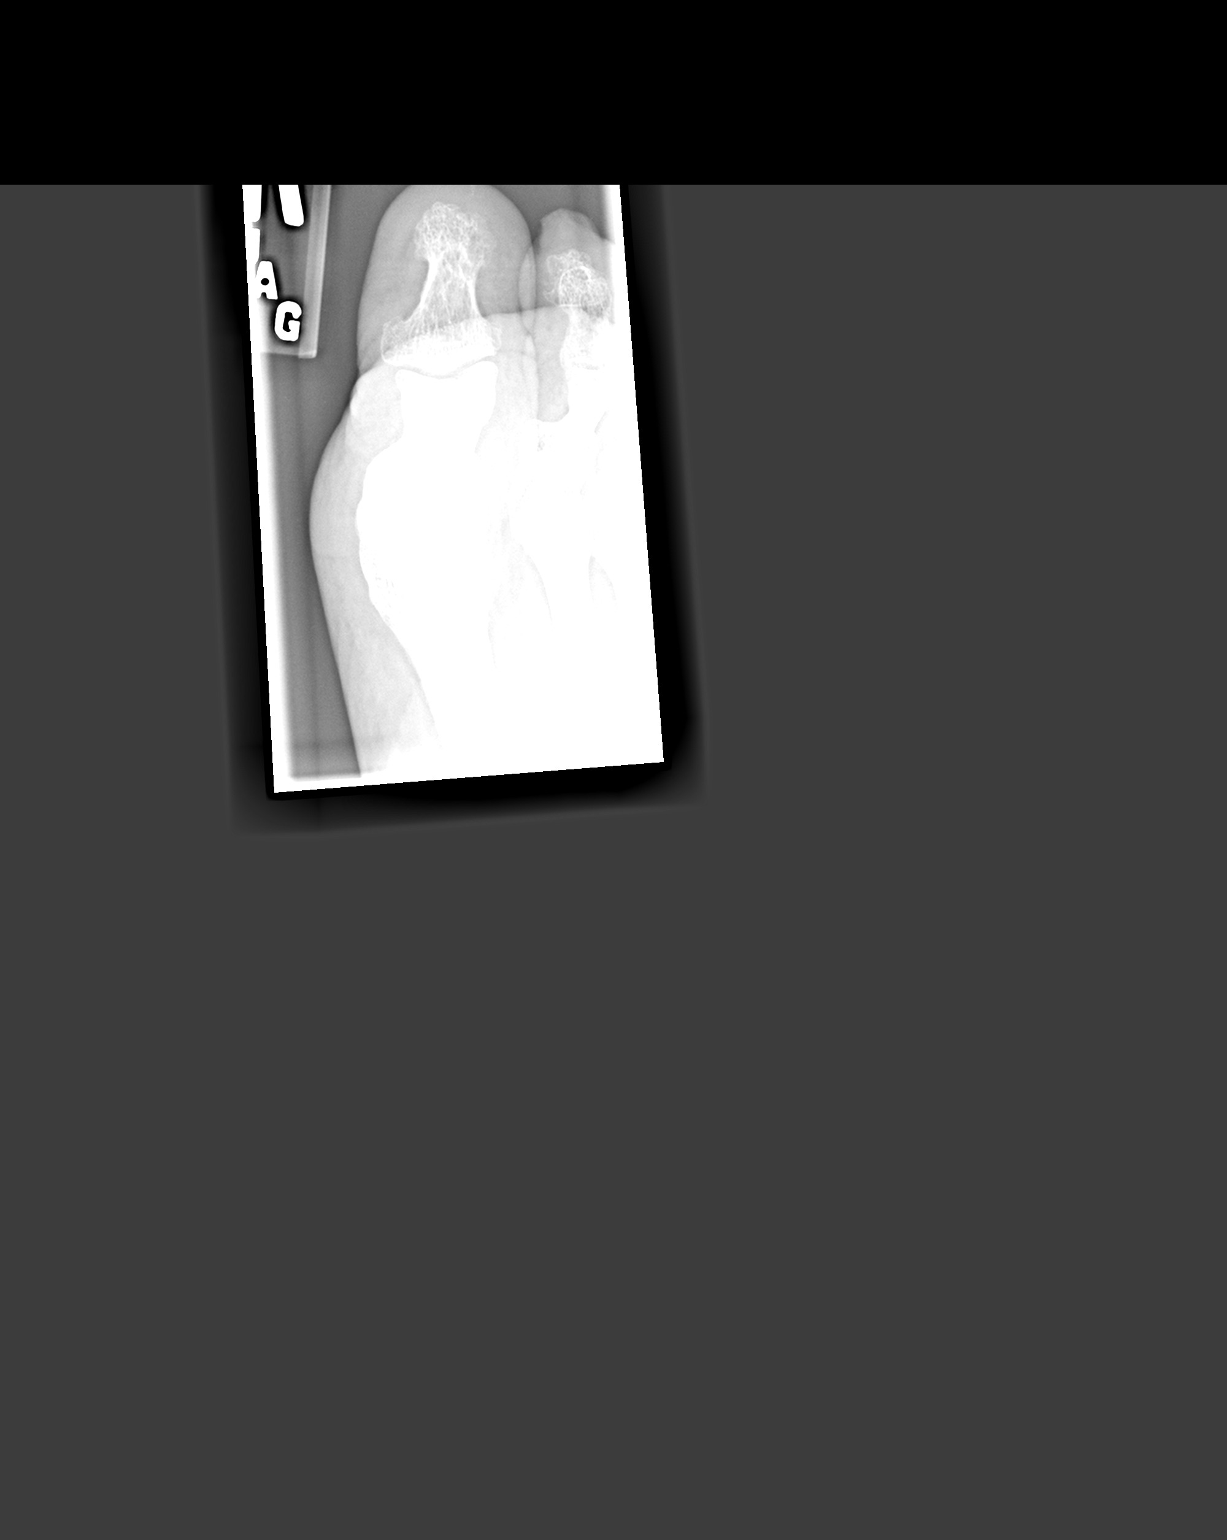

[view not recorded (2 of 3)]
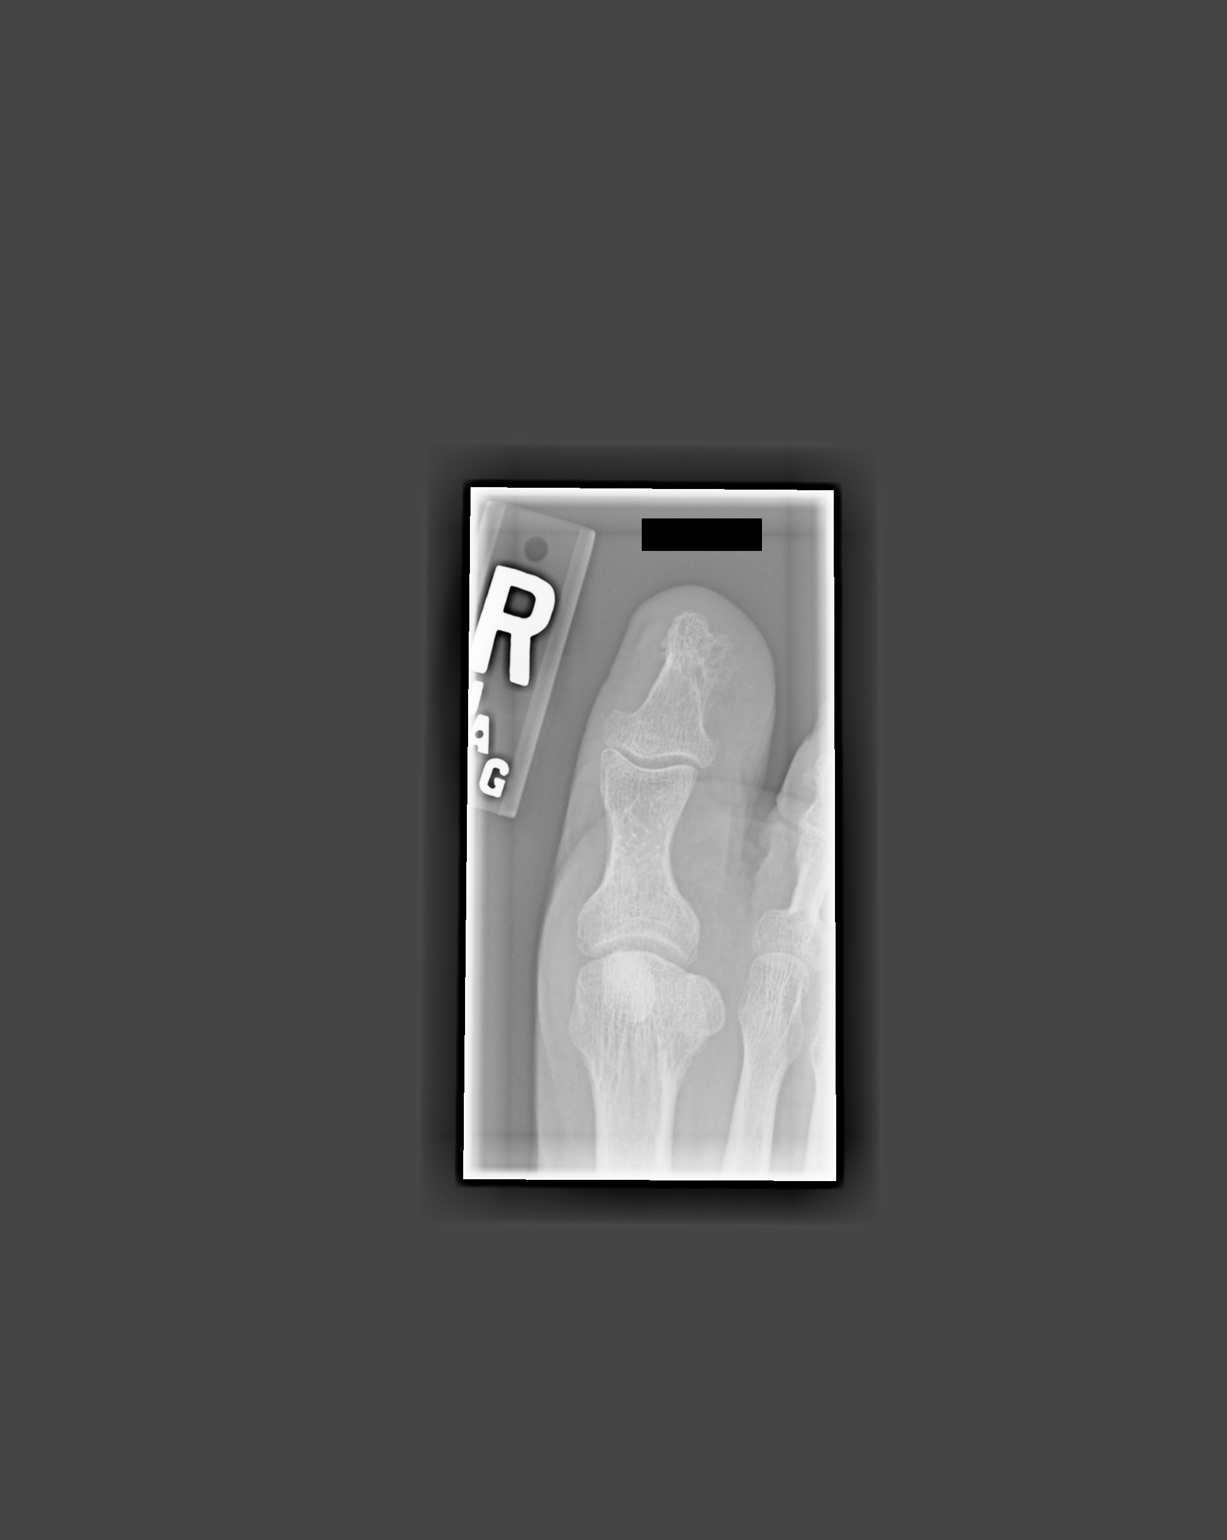

[view not recorded (3 of 3)]
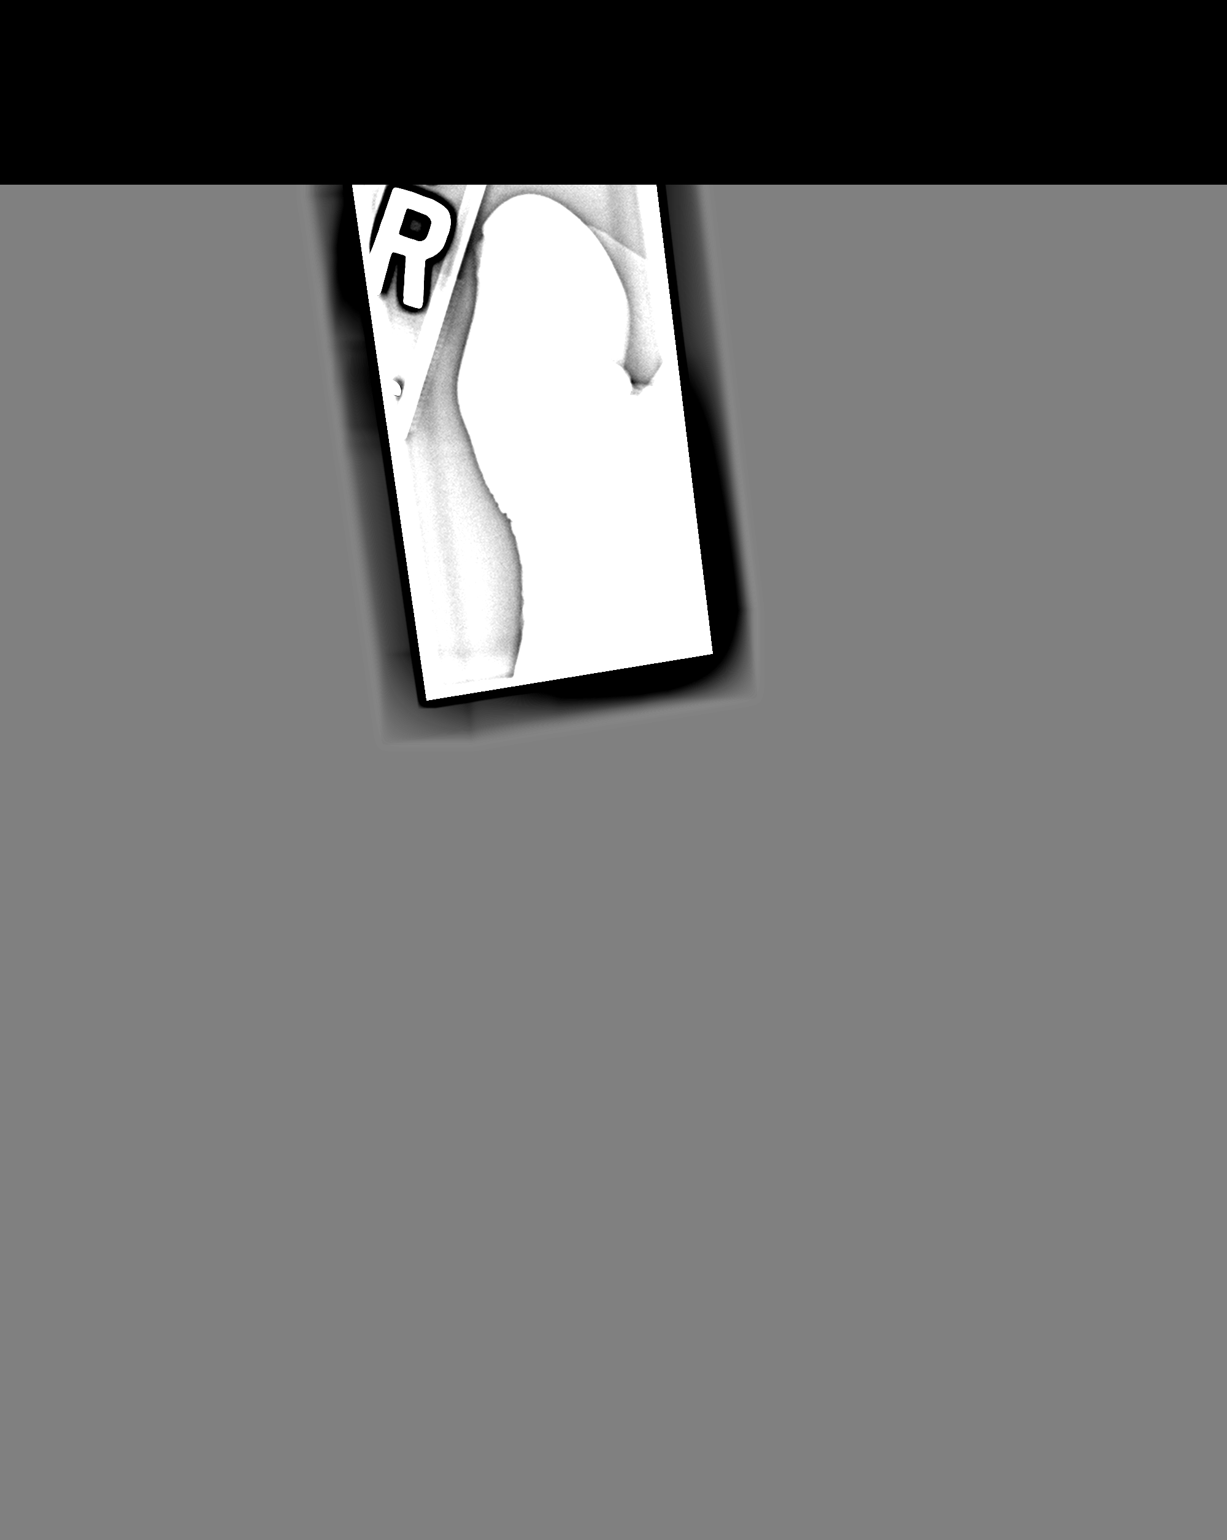

[3 of 3 positions shown; findings below may reference images not displayed]

FINDINGS: There is no evidence of fracture or dislocation.  There
is no evidence of arthropathy or other focal bone abnormality.
Soft tissues are unremarkable.
IMPRESSION: No bony destruction to suggest osteomyelitis.

## 2014-07-22 ENCOUNTER — Encounter (HOSPITAL_COMMUNITY): Payer: Self-pay | Admitting: Emergency Medicine

## 2016-09-27 ENCOUNTER — Telehealth: Payer: Self-pay | Admitting: Orthopedic Surgery

## 2016-11-15 ENCOUNTER — Telehealth: Payer: Self-pay | Admitting: Orthopedic Surgery

## 2016-11-15 NOTE — Telephone Encounter (Signed)
Patient had records and film sent to [place] from her orthopaedic surgeon in [place]; stated at the time, she is living back here in [place].  States she previously worked at [employer]. She initially contacted our office on Friday 10/15/16. I had spoken with Dr Aline Brochure and relayed the general information that she shared, which indicated that Dr Aline Brochure would need to further review.* Pt's ph# [phone] *On 10/29/16 I called back to patient and relayed that Dr Aline Brochure, upon reviewing her records, would be unable to perform the total joint surgery due to patient's need for close medical management, and based on patient's medical history, and that Dr Aline Brochure had declined.  (Records ready for pick-up by patient) Patient called back on 11/04/16 and left a voice message requesting that, and stated that she never spoke with me, and never received any information from our office at all.  On 11/04/16 I therefore asked Dr Aline Brochure to re-review.  Per Dr Ruthe Mannan response back on 11/08/16, he declined, for same reasons as noted. On this date, 11/08/16, I called back to patient.  I reached her voice message and asked her to return the call as I had information to relay. As of Friday, 11/12/16, no response was received.  On Monday, 11/15/16 - Patient  left a voice message, asking for someone to call her back, as she had not heard anything.  Later this same day, 11/15/16, patient came by our office and again stated that she never received my voice message.  I apologized several times that she did not get the message. I relayed that I had been out of the office toward the end of last week, and was going to call her back today.  I relayed the information per Dr Aline Brochure, thanked patient for the opportunity to review her records and wished her all the best.  She said she would be contacting another doctor.

## 2016-11-24 ENCOUNTER — Ambulatory Visit (INDEPENDENT_AMBULATORY_CARE_PROVIDER_SITE_OTHER): Payer: Medicare Other | Admitting: Orthopaedic Surgery

## 2016-11-24 ENCOUNTER — Encounter (INDEPENDENT_AMBULATORY_CARE_PROVIDER_SITE_OTHER): Payer: Self-pay | Admitting: Orthopaedic Surgery

## 2016-11-24 VITALS — BP 166/93 | HR 78 | Resp 14 | Ht 64.0 in | Wt 182.0 lb

## 2016-11-24 DIAGNOSIS — M25561 Pain in right knee: Secondary | ICD-10-CM | POA: Diagnosis not present

## 2016-11-24 DIAGNOSIS — G8929 Other chronic pain: Secondary | ICD-10-CM

## 2016-11-24 NOTE — Progress Notes (Signed)
 Office Visit Note   Patient: Ashley Gonzales           Date of Birth: 10/29/1940           MRN: [id number] Visit Date: 11/24/2016              Requested by: No referring provider defined for this encounter. PCP: PROVIDER NOT IN SYSTEM   Assessment & Plan: Visit Diagnoses: End-stage osteoarthritis right knee. #2 left below-knee amputation  Plan: Long discussion with Ashley Gonzales regarding the arthritis in her right knee and indications for considering a right total knee replacement. She preferred to try conservative treatment rather than the surgery. She does live by herself but has family members close by. We will try a spider brace. She has many comorbidities which are concern to me in terms of her surgery but she certainly is symptomatic in terms of falling and pain. Plan to see her back in the next 4-6 weeks to revisit the issue of knee replacement.   Follow-Up Instructions: No Follow-up on file.   Orders:  No orders of the defined types were placed in this encounter.  No orders of the defined types were placed in this encounter.     Procedures: No procedures performed   Clinical Data: No additional findings. Outside films  of the right knee were reviewed. Diffuse bony demineralization. Essentially end-stage osteoarthritis in all 3 compartments with decreased joint space both medially and laterally. Peripheral osteophytes medially and laterally. Alignment appears to be normal.  Subjective: Chief Complaint  Patient presents with  . Right Knee - Pain    Ashley Gonzales presents with chronic right knee pain. Pt had right knee arthroscopy 2 x several years ago by Dr. Keeling. Her PCP Dr. Beeter in [place].  Ashley. Gonzales has had chronic pain in her right knee related to the osteoarthritis. She has been followed by [facility] in [place] in the past with cortisone injections. She was recently told she needed to have her "knee replaced". Slowly, she is living in [place]. Dr.  Keeling performed arthroscopic debridement in that same knee a least 2 occasions.  Ashley Gonzales had a left below-knee amputationwhile living in [place] when she was approximately 76 years old and has done very well using a cane. Her concerns with her right knee relate to multiple falls as the knee is unstable, and pain. She has tried bracing in the past but "lost them".  Review of Systems   Objective: Vital Signs: There were no vitals taken for this visit.  Physical Exam  Ortho Exam right knee exam with full extension and approximately 90 of flexion. Minimal effusion. Both medial lateral joint pain with patella crepitation. Some induration and brawny appearance of  the right calf and leg with distal edema diabetic neuropathy in foot with altered sensibility. +1 pulses. Left below-knee amputation prosthesis intact.  Specialty Comments:  No specialty comments available.  Imaging: No results found.   PMFS History: Patient Active Problem List   Diagnosis Date Noted  . Adenocarcinoma of left breast 06/23/2012   Past Medical History:  Diagnosis Date  . 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 She had a 3.2 cm primary that was ER positive, PR negative, 12 negative nodes, HER-2/neu was negative, S-phase fraction was low at 3.3%. She did take tamoxifen for a short time, but stopped it due to "side effects"   and she refused further consideration of therapy.    . Breast cancer   . Diabetes mellitus   . Hypertension   . S/P colonoscopy 2013  . Sickle cell trait     Family History  Problem Relation Age of Onset  . Cancer Other   . Diabetes Other     Past Surgical History:  Procedure Laterality Date  . ABDOMINAL HYSTERECTOMY    . APPENDECTOMY    . BACK SURGERY  96 and 93  . CATARACT EXTRACTION  2012  . cataract surgery    . CHOLECYSTECTOMY    . KNEE  ARTHROSCOPY    . left mastectomy  1999  . MASTECTOMY PARTIAL / LUMPECTOMY  1986  . NECK SURGERY  2007  . TUBAL LIGATION  1971   Social History   Occupational History  . Not on file.   Social History Main Topics  . Smoking status: Never Smoker  . Smokeless tobacco: Never Used  . Alcohol use No  . Drug use: No  . Sexual activity: Not on file       

## 2021-09-20 DEATH — deceased
